# Patient Record
Sex: Male | Born: 1955 | Race: White | Hispanic: No | Marital: Married | Smoking: Never smoker
Health system: Southern US, Community
[De-identification: ages and names within clinical notes are randomized; demographics above are authoritative.]

## PROBLEM LIST (undated history)

## (undated) DIAGNOSIS — I82409 Acute embolism and thrombosis of unspecified deep veins of unspecified lower extremity: Secondary | ICD-10-CM

## (undated) DIAGNOSIS — Z86010 Personal history of colonic polyps: Principal | ICD-10-CM

## (undated) DIAGNOSIS — D689 Coagulation defect, unspecified: Secondary | ICD-10-CM

## (undated) HISTORY — PX: POLYPECTOMY: SHX149

## (undated) HISTORY — DX: Coagulation defect, unspecified: D68.9

## (undated) HISTORY — DX: Personal history of colonic polyps: Z86.010

---

## 2006-12-21 ENCOUNTER — Ambulatory Visit: Payer: Self-pay | Admitting: Family Medicine

## 2006-12-24 LAB — CONVERTED CEMR LAB
Alkaline Phosphatase: 46 units/L (ref 39–117)
BUN: 19 mg/dL (ref 6–23)
Bilirubin, Direct: 0.1 mg/dL (ref 0.0–0.3)
CO2: 31 meq/L (ref 19–32)
Cholesterol: 173 mg/dL (ref 0–200)
Creatinine, Ser: 1 mg/dL (ref 0.4–1.5)
Eosinophils Absolute: 0 10*3/uL (ref 0.0–0.6)
Glucose, Bld: 93 mg/dL (ref 70–99)
HDL: 59.5 mg/dL (ref >39.0)
Lymphocytes Relative: 28.5 % (ref 12.0–46.0)
MCHC: 34.9 g/dL (ref 30.0–36.0)
MCV: 88.2 fL (ref 78.0–100.0)
Neutro Abs: 3.7 10*3/uL (ref 1.4–7.7)
Platelets: 225 10*3/uL (ref 150–400)
Potassium: 4.5 meq/L (ref 3.5–5.1)
RBC: 4.61 M/uL (ref 4.22–5.81)
Total Bilirubin: 0.8 mg/dL (ref 0.3–1.2)
Total CHOL/HDL Ratio: 2.9
Total Protein: 7.2 g/dL (ref 6.0–8.3)
Triglycerides: 50 mg/dL (ref 0–149)

## 2006-12-25 ENCOUNTER — Encounter: Payer: Self-pay | Admitting: Family Medicine

## 2007-12-24 ENCOUNTER — Ambulatory Visit: Payer: Self-pay | Admitting: Family Medicine

## 2007-12-24 LAB — CONVERTED CEMR LAB
Glucose, Urine, Semiquant: NEGATIVE
Urobilinogen, UA: 0.2
WBC Urine, dipstick: NEGATIVE

## 2008-01-06 ENCOUNTER — Ambulatory Visit: Payer: Self-pay | Admitting: Family Medicine

## 2008-01-06 LAB — CONVERTED CEMR LAB
ALT: 15 units/L (ref 0–53)
AST: 23 units/L (ref 0–37)
Albumin: 4.4 g/dL (ref 3.5–5.2)
BUN: 18 mg/dL (ref 6–23)
Basophils Relative: 0.5 % (ref 0.0–3.0)
CO2: 29 meq/L (ref 19–32)
Chloride: 110 meq/L (ref 96–112)
Creatinine, Ser: 1.1 mg/dL (ref 0.4–1.5)
Eosinophils Relative: 1.2 % (ref 0.0–5.0)
LDL Cholesterol: 93 mg/dL (ref 0–99)
Lymphocytes Relative: 37.5 % (ref 12.0–46.0)
Monocytes Relative: 8.7 % (ref 3.0–12.0)
Neutrophils Relative %: 52.1 % (ref 43.0–77.0)
PSA: 0.56 ng/mL (ref 0.10–4.00)
RBC: 4.64 M/uL (ref 4.22–5.81)
Total Bilirubin: 0.9 mg/dL (ref 0.3–1.2)
VLDL: 8 mg/dL (ref 0–40)
WBC: 4.1 10*3/uL — ABNORMAL LOW (ref 4.5–10.5)

## 2009-01-04 ENCOUNTER — Ambulatory Visit: Payer: Self-pay | Admitting: Family Medicine

## 2009-01-04 LAB — CONVERTED CEMR LAB
Ketones, urine, test strip: NEGATIVE
Nitrite: NEGATIVE
Urobilinogen, UA: 0.2

## 2009-01-16 LAB — CONVERTED CEMR LAB
ALT: 19 units/L (ref 0–53)
Alkaline Phosphatase: 42 units/L (ref 39–117)
Basophils Relative: 0.3 % (ref 0.0–3.0)
Bilirubin, Direct: 0.1 mg/dL (ref 0.0–0.3)
Calcium: 9.1 mg/dL (ref 8.4–10.5)
Chloride: 104 meq/L (ref 96–112)
Creatinine, Ser: 1 mg/dL (ref 0.4–1.5)
Eosinophils Relative: 1.5 % (ref 0.0–5.0)
GFR calc non Af Amer: 83.79 mL/min (ref >60)
LDL Cholesterol: 89 mg/dL (ref 0–99)
Lymphocytes Relative: 36.2 % (ref 12.0–46.0)
MCV: 94.3 fL (ref 78.0–100.0)
Monocytes Relative: 9 % (ref 3.0–12.0)
Neutrophils Relative %: 53 % (ref 43.0–77.0)
RBC: 4.46 M/uL (ref 4.22–5.81)
Total Bilirubin: 0.9 mg/dL (ref 0.3–1.2)
Total CHOL/HDL Ratio: 3
Total Protein: 7.5 g/dL (ref 6.0–8.3)
Triglycerides: 58 mg/dL (ref 0.0–149.0)
VLDL: 11.6 mg/dL (ref 0.0–40.0)
WBC: 4.5 10*3/uL (ref 4.5–10.5)

## 2009-01-22 ENCOUNTER — Ambulatory Visit: Payer: Self-pay | Admitting: Family Medicine

## 2009-02-09 ENCOUNTER — Encounter (INDEPENDENT_AMBULATORY_CARE_PROVIDER_SITE_OTHER): Payer: Self-pay | Admitting: *Deleted

## 2009-02-20 ENCOUNTER — Telehealth: Payer: Self-pay | Admitting: Family Medicine

## 2009-03-02 ENCOUNTER — Encounter (INDEPENDENT_AMBULATORY_CARE_PROVIDER_SITE_OTHER): Payer: Self-pay

## 2009-03-02 ENCOUNTER — Ambulatory Visit: Payer: Self-pay | Admitting: Internal Medicine

## 2009-03-19 ENCOUNTER — Ambulatory Visit: Payer: Self-pay | Admitting: Internal Medicine

## 2009-03-19 HISTORY — PX: COLONOSCOPY: SHX174

## 2009-03-21 ENCOUNTER — Encounter: Payer: Self-pay | Admitting: Internal Medicine

## 2012-02-06 ENCOUNTER — Encounter: Payer: Self-pay | Admitting: Internal Medicine

## 2012-10-13 ENCOUNTER — Encounter: Payer: Self-pay | Admitting: Internal Medicine

## 2014-08-15 ENCOUNTER — Encounter: Payer: Self-pay | Admitting: Internal Medicine

## 2015-12-04 ENCOUNTER — Telehealth: Payer: Self-pay | Admitting: Family Medicine

## 2015-12-04 NOTE — Telephone Encounter (Signed)
Pt last seen dr fry 2010 and would to re-est. Can I sch?

## 2015-12-05 NOTE — Telephone Encounter (Signed)
Yes I can see him again

## 2015-12-06 NOTE — Telephone Encounter (Signed)
lmom for pt to call back

## 2015-12-06 NOTE — Telephone Encounter (Signed)
Pt has been scheduled.  °

## 2015-12-07 ENCOUNTER — Ambulatory Visit (INDEPENDENT_AMBULATORY_CARE_PROVIDER_SITE_OTHER): Payer: BLUE CROSS/BLUE SHIELD | Admitting: Family Medicine

## 2015-12-07 ENCOUNTER — Encounter: Payer: Self-pay | Admitting: Family Medicine

## 2015-12-07 VITALS — BP 106/71 | HR 59 | Temp 97.9°F | Ht 69.5 in | Wt 174.0 lb

## 2015-12-07 DIAGNOSIS — J069 Acute upper respiratory infection, unspecified: Secondary | ICD-10-CM | POA: Diagnosis not present

## 2015-12-07 NOTE — Progress Notes (Signed)
Pre visit review using our clinic review tool, if applicable. No additional management support is needed unless otherwise documented below in the visit note. 

## 2015-12-07 NOTE — Progress Notes (Signed)
   Subjective:    Patient ID: Carrel Calfee, male    DOB: 30-Mar-1956, 60 y.o.   MRN: [id number]  HPI Here for 5 weeks of a dry cough which is actually getting much better this week. For the first 2 weeks he felt alittle achy and had a low grade fever, but these resolved.  No ST or sinus congestion. He took Delsym for a week but nothing since then.    Review of Systems  Constitutional: Negative.   HENT: Negative.   Eyes: Negative.   Respiratory: Positive for cough.   Cardiovascular: Negative.        Objective:   Physical Exam  Constitutional: He appears well-developed and well-nourished.  HENT:  Right Ear: External ear normal.  Left Ear: External ear normal.  Nose: Nose normal.  Mouth/Throat: Oropharynx is clear and moist.  Eyes: Conjunctivae are normal.  Neck: No thyromegaly present.  Cardiovascular: Normal rate, regular rhythm, normal heart sounds and intact distal pulses.   Pulmonary/Chest: Effort normal and breath sounds normal. No respiratory distress. He has no wheezes. He has no rales.  Lymphadenopathy:    He has no cervical adenopathy.          Assessment & Plan:  He is getting over a viral URI. He seems to be at the end of it, so no treatment is required.  Laurey Morale, MD

## 2016-01-23 ENCOUNTER — Telehealth: Payer: Self-pay | Admitting: Family Medicine

## 2016-01-23 NOTE — Telephone Encounter (Signed)
Pt was seen on 12-07-15 for uri. Pt was told if no better in few week to callback. Please advice

## 2016-01-23 NOTE — Telephone Encounter (Signed)
Call in Biaxin 500 mg to take bid for 10 days

## 2016-01-24 NOTE — Telephone Encounter (Signed)
Left voicemail for patient asking for which pharmacy he would the medication sent too.

## 2016-01-25 MED ORDER — CLARITHROMYCIN 500 MG PO TABS: 500.0000 mg | ORAL_TABLET | Freq: Two times a day (BID) | ORAL | 0 refills | Status: DC

## 2016-01-25 NOTE — Telephone Encounter (Signed)
Pt pharm is [facility] [address]

## 2016-01-25 NOTE — Telephone Encounter (Signed)
Rx has been sent  

## 2016-02-19 ENCOUNTER — Encounter: Payer: Self-pay | Admitting: Family Medicine

## 2016-02-19 ENCOUNTER — Ambulatory Visit (INDEPENDENT_AMBULATORY_CARE_PROVIDER_SITE_OTHER): Payer: BLUE CROSS/BLUE SHIELD | Admitting: Family Medicine

## 2016-02-19 VITALS — BP 121/76 | HR 52 | Temp 98.2°F | Ht 69.25 in | Wt 178.0 lb

## 2016-02-19 DIAGNOSIS — Z0000 Encounter for general adult medical examination without abnormal findings: Secondary | ICD-10-CM

## 2016-02-19 LAB — BASIC METABOLIC PANEL
BUN: 16 mg/dL (ref 6–23)
CALCIUM: 9.6 mg/dL (ref 8.4–10.5)
CO2: 28 meq/L (ref 19–32)
Chloride: 101 mEq/L (ref 96–112)
Creatinine, Ser: 1.01 mg/dL (ref 0.40–1.50)
GFR: 80.65 mL/min (ref >60.00)
GLUCOSE: 84 mg/dL (ref 70–99)
Potassium: 4.2 mEq/L (ref 3.5–5.1)
SODIUM: 138 meq/L (ref 135–145)

## 2016-02-19 LAB — LIPID PANEL
CHOL/HDL RATIO: 2
Cholesterol: 197 mg/dL (ref 0–200)
HDL: 94.7 mg/dL (ref >39.00)
LDL CALC: 91 mg/dL (ref 0–99)
NONHDL: 101.81
TRIGLYCERIDES: 52 mg/dL (ref 0.0–149.0)
VLDL: 10.4 mg/dL (ref 0.0–40.0)

## 2016-02-19 LAB — HEPATIC FUNCTION PANEL
ALBUMIN: 4.6 g/dL (ref 3.5–5.2)
ALK PHOS: 54 U/L (ref 39–117)
ALT: 25 U/L (ref 0–53)
AST: 33 U/L (ref 0–37)
BILIRUBIN DIRECT: 0.2 mg/dL (ref 0.0–0.3)
TOTAL PROTEIN: 7.2 g/dL (ref 6.0–8.3)
Total Bilirubin: 0.8 mg/dL (ref 0.2–1.2)

## 2016-02-19 LAB — CBC WITH DIFFERENTIAL/PLATELET
BASOS PCT: 0.3 % (ref 0.0–3.0)
Basophils Absolute: 0 10*3/uL (ref 0.0–0.1)
EOS PCT: 0.7 % (ref 0.0–5.0)
Eosinophils Absolute: 0 10*3/uL (ref 0.0–0.7)
HCT: 42.4 % (ref 39.0–52.0)
Hemoglobin: 14.4 g/dL (ref 13.0–17.0)
LYMPHS ABS: 2.6 10*3/uL (ref 0.7–4.0)
Lymphocytes Relative: 36.1 % (ref 12.0–46.0)
MCHC: 34 g/dL (ref 30.0–36.0)
MCV: 89.5 fl (ref 78.0–100.0)
MONO ABS: 0.6 10*3/uL (ref 0.1–1.0)
Monocytes Relative: 9 % (ref 3.0–12.0)
NEUTROS PCT: 53.9 % (ref 43.0–77.0)
Neutro Abs: 3.8 10*3/uL (ref 1.4–7.7)
PLATELETS: 182 10*3/uL (ref 150.0–400.0)
RBC: 4.74 Mil/uL (ref 4.22–5.81)
RDW: 13.4 % (ref 11.5–15.5)
WBC: 7.1 10*3/uL (ref 4.0–10.5)

## 2016-02-19 LAB — POC URINALSYSI DIPSTICK (AUTOMATED)
Bilirubin, UA: NEGATIVE
Blood, UA: NEGATIVE
GLUCOSE UA: NEGATIVE
KETONES UA: NEGATIVE
Leukocytes, UA: NEGATIVE
Nitrite, UA: NEGATIVE
Protein, UA: NEGATIVE
SPEC GRAV UA: 1.015
Urobilinogen, UA: 0.2
pH, UA: 6

## 2016-02-19 LAB — PSA: PSA: 0.74 ng/mL (ref 0.10–4.00)

## 2016-02-19 LAB — TSH: TSH: 1.51 u[IU]/mL (ref 0.35–4.50)

## 2016-02-19 MED ORDER — SILDENAFIL CITRATE 100 MG PO TABS: 100.0000 mg | ORAL_TABLET | Freq: Every day | ORAL | 11 refills | Status: DC | PRN

## 2016-02-19 NOTE — Progress Notes (Signed)
   Subjective:    Patient ID: James Cruz, male    DOB: 05-14-1955, 60 y.o.   MRN: [id number]  HPI 60 yr old male for a well exam. He feels fine but has some issues to discuss. He has had 2 lesions on the face for years but they are slowly getting larger and he may want them removed. Also he wants to try Viagra for erection difficulties.     Review of Systems  Constitutional: Negative.   HENT: Negative.   Eyes: Negative.   Respiratory: Negative.   Cardiovascular: Negative.   Gastrointestinal: Negative.   Genitourinary: Negative.   Musculoskeletal: Negative.   Skin: Negative.   Neurological: Negative.   Psychiatric/Behavioral: Negative.        Objective:   Physical Exam  Constitutional: He is oriented to person, place, and time. He appears well-developed and well-nourished. No distress.  HENT:  Head: Normocephalic and atraumatic.  Right Ear: External ear normal.  Left Ear: External ear normal.  Nose: Nose normal.  Mouth/Throat: Oropharynx is clear and moist. No oropharyngeal exudate.  Eyes: Conjunctivae and EOM are normal. Pupils are equal, round, and reactive to light. Right eye exhibits no discharge. Left eye exhibits no discharge. No scleral icterus.  Neck: Neck supple. No JVD present. No tracheal deviation present. No thyromegaly present.  Cardiovascular: Normal rate, regular rhythm, normal heart sounds and intact distal pulses.  Exam reveals no gallop and no friction rub.   No murmur heard. Pulmonary/Chest: Effort normal and breath sounds normal. No respiratory distress. He has no wheezes. He has no rales. He exhibits no tenderness.  Abdominal: Soft. Bowel sounds are normal. He exhibits no distension and no mass. There is no tenderness. There is no rebound and no guarding.  Genitourinary: Rectum normal, prostate normal and penis normal. Rectal exam shows guaiac negative stool. No penile tenderness.  Musculoskeletal: Normal range of motion. He exhibits no edema or  tenderness.  Lymphadenopathy:    He has no cervical adenopathy.  Neurological: He is alert and oriented to person, place, and time. He has normal reflexes. No cranial nerve deficit. He exhibits normal muscle tone. Coordination normal.  Skin: Skin is warm and dry. No rash noted. He is not diaphoretic. No erythema. No pallor.  There is a firm papular lesion on the upper lip and and a fleshy papular lesion on the left corner of the mouth. These both appear to be benign.  Psychiatric: He has a normal mood and affect. His behavior is normal. Judgment and thought content normal.          Assessment & Plan:  Well exam. He will get fasting labs. He is quite past due for a follow up colonoscopy, so we will set this up. He wants to check his work schedule before we refer to Dermatology to remove the facial lesions. Try Viagra for the ED.  Laurey Morale, MD

## 2016-02-19 NOTE — Progress Notes (Signed)
Pre visit review using our clinic review tool, if applicable. No additional management support is needed unless otherwise documented below in the visit note. 

## 2016-02-29 ENCOUNTER — Telehealth: Payer: Self-pay | Admitting: Family Medicine

## 2016-02-29 NOTE — Telephone Encounter (Signed)
Wife would like to know if pt's health assessment form is ready to be picked up.

## 2016-03-03 NOTE — Telephone Encounter (Signed)
Form is ready for pick up here at front office and I left a voice message for pt with this information.

## 2016-03-14 ENCOUNTER — Telehealth: Payer: Self-pay | Admitting: Family Medicine

## 2016-03-14 NOTE — Telephone Encounter (Signed)
Insurance will only cover Cialis and send to [facility].

## 2016-03-14 NOTE — Telephone Encounter (Signed)
Cancel Viagra and change to Cialis 20 mg to take prn, #10 with 11 rf

## 2016-03-17 MED ORDER — TADALAFIL 20 MG PO TABS: ORAL_TABLET | ORAL | 11 refills | Status: AC

## 2016-03-17 NOTE — Telephone Encounter (Signed)
Rx sent 

## 2016-05-05 ENCOUNTER — Encounter: Payer: Self-pay | Admitting: Family Medicine

## 2016-10-31 ENCOUNTER — Emergency Department (HOSPITAL_COMMUNITY): Payer: BLUE CROSS/BLUE SHIELD

## 2016-10-31 ENCOUNTER — Encounter (HOSPITAL_COMMUNITY): Payer: Self-pay | Admitting: Nurse Practitioner

## 2016-10-31 ENCOUNTER — Emergency Department (HOSPITAL_COMMUNITY)
Admission: EM | Admit: 2016-10-31 | Discharge: 2016-10-31 | Disposition: A | Discharge status: Home / Self Care | Payer: BLUE CROSS/BLUE SHIELD | Attending: Emergency Medicine | Admitting: Emergency Medicine

## 2016-10-31 DIAGNOSIS — Y999 Unspecified external cause status: Secondary | ICD-10-CM | POA: Insufficient documentation

## 2016-10-31 DIAGNOSIS — S6992XA Unspecified injury of left wrist, hand and finger(s), initial encounter: Secondary | ICD-10-CM | POA: Diagnosis present

## 2016-10-31 DIAGNOSIS — Y9316 Activity, rowing, canoeing, kayaking, rafting and tubing: Secondary | ICD-10-CM | POA: Diagnosis not present

## 2016-10-31 DIAGNOSIS — Y9289 Other specified places as the place of occurrence of the external cause: Secondary | ICD-10-CM | POA: Insufficient documentation

## 2016-10-31 DIAGNOSIS — X58XXXA Exposure to other specified factors, initial encounter: Secondary | ICD-10-CM | POA: Diagnosis not present

## 2016-10-31 DIAGNOSIS — S62665A Nondisplaced fracture of distal phalanx of left ring finger, initial encounter for closed fracture: Secondary | ICD-10-CM | POA: Insufficient documentation

## 2016-10-31 DIAGNOSIS — S60445A External constriction of left ring finger, initial encounter: Secondary | ICD-10-CM | POA: Insufficient documentation

## 2016-10-31 DIAGNOSIS — W4904XA Ring or other jewelry causing external constriction, initial encounter: Secondary | ICD-10-CM | POA: Insufficient documentation

## 2016-10-31 DIAGNOSIS — S62669A Nondisplaced fracture of distal phalanx of unspecified finger, initial encounter for closed fracture: Secondary | ICD-10-CM

## 2016-10-31 HISTORY — DX: Acute embolism and thrombosis of unspecified deep veins of unspecified lower extremity: I82.409

## 2016-10-31 NOTE — ED Provider Notes (Signed)
[facility] DEPT Provider Note   CSN: [id number] Arrival date & time: 10/31/16  1146     History   Chief Complaint Chief Complaint  Patient presents with  . Finger Injury    HPI James Cruz is a 61 y.o. male.  Finger injury 2 days ago riding a water tube.  Persistent pain and swelling.  Concern about injury being on ring finger with ring still on.  No other injuries.  There are no other known modifying factors.  HPI  Past Medical History:  Diagnosis Date  . DVT (deep venous thrombosis) ([place])     There are no active problems to display for this patient.   Past Surgical History:  Procedure Laterality Date  . COLONOSCOPY  03/19/2009   per Dr. Carlean Purl, adenomatous polyps, repeat in 3 yrs        Home Medications    Prior to Admission medications   Medication Sig Start Date End Date Taking? Authorizing Provider  tadalafil (CIALIS) 20 MG tablet Take 1 tablet by mouth as needed. 03/17/16   Laurey Morale, MD    Family History History reviewed. No pertinent family history.  Social History Social History  Substance Use Topics  . Smoking status: Never Smoker  . Smokeless tobacco: Never Used  . Alcohol use 1.2 oz/week    2 Cans of beer per week     Comment: most evenings     Allergies   Patient has no known allergies.   Review of Systems Review of Systems  All other systems reviewed and are negative.    Physical Exam Updated Vital Signs BP 123/86 (BP Location: Right Arm)   Pulse (!) 53   Temp 97.7 F (36.5 C) (Oral)   Resp 20   Ht 5\' 9"  (1.753 m)   Wt 77.1 kg (170 lb)   SpO2 98%   BMI 25.10 kg/m   Physical Exam  Constitutional: He is oriented to person, place, and time. He appears well-developed and well-nourished.  HENT:  Head: Normocephalic and atraumatic.  Right Ear: External ear normal.  Left Ear: External ear normal.  Eyes: Pupils are equal, round, and reactive to light. Conjunctivae and EOM are normal.  Neck: Normal range of  motion and phonation normal. Neck supple.  Cardiovascular: Normal rate.   Pulmonary/Chest: Effort normal. He exhibits no bony tenderness.  Musculoskeletal:  Left ring finger tender and swollen primarily distally with ecchymosis proximal to the nail.  Fair motion at PIP and DIP joints.  No significant deformity.  Neurovascular intact distally.  Ring on fourth finger appears to be impeding lymphatic flow proximally.  Neurological: He is alert and oriented to person, place, and time. No cranial nerve deficit or sensory deficit. He exhibits normal muscle tone. Coordination normal.  Skin: Skin is warm, dry and intact.  Psychiatric: He has a normal mood and affect. His behavior is normal. Judgment and thought content normal.  Nursing note and vitals reviewed.    ED Treatments / Results  Labs (all labs ordered are listed, but only abnormal results are displayed) Labs Reviewed - No data to display  EKG  EKG Interpretation None       Radiology Dg Finger Ring Left  Result Date: 10/31/2016 CLINICAL DATA:  61 year old male with pain at the distal aspect of the left ring finger EXAM: LEFT RING FINGER 2+V COMPARISON:  None. FINDINGS: Nondisplaced fracture through the base of the distal phalanx of the ring finger. The remainder the visualized bones and joints are unremarkable. IMPRESSION:  Nondisplaced fracture through the base of the distal phalanx of the ring finger. Electronically Signed   By: Jacqulynn Cadet M.D.   On: 10/31/2016 12:55    Procedures .Foreign Body Removal Date/Time: 10/31/2016 5:14 PM Performed by: Daleen Bo Authorized by: Daleen Bo  Risks and benefits: risks, benefits and alternatives were discussed Consent given by: patient Patient understanding: patient states understanding of the procedure being performed Patient identity confirmed: verbally with patient Time out: Immediately prior to procedure a "time out" was called to verify the correct patient, procedure,  equipment, support staff and site/side marked as required. Intake: Left ring finger. 1 objects recovered. Objects recovered: Wedding band Patient tolerance: Patient tolerated the procedure well with no immediate complications Comments: Ring removed using umbilical tape, to compress edema, and pulling off, in standard technique.   (including critical care time)  Medications Ordered in ED Medications - No data to display   Initial Impression / Assessment and Plan / ED Course  I have reviewed the triage vital signs and the nursing notes.  Pertinent labs & imaging results that were available during my care of the patient were reviewed by me and considered in my medical decision making (see chart for details).      Patient Vitals for the past 24 hrs:  BP Temp Temp src Pulse Resp SpO2 Height Weight  10/31/16 1316 123/86 - - (!) 53 20 98 % - -  10/31/16 1154 (!) 142/90 97.7 F (36.5 C) Oral (!) 51 18 100 % 5\' 9"  (1.753 m) 77.1 kg (170 lb)    At discharge- reevaluation with update and discussion. After initial assessment and treatment, an updated evaluation reveals he is comfortable has no further complaints.  Findings discussed with the patient and all questions answered. Mistey Hoffert L    Final Clinical Impressions(s) / ED Diagnoses   Final diagnoses:  Closed nondisplaced fracture of distal phalanx of finger, unspecified finger, initial encounter    Finger fracture, nondisplaced, without nail injury, complicated by ring impeding lymphatic drainage.  Ring removed to improve recovery and prevent ischemia.  Findings discussed with the patient.  Nursing Notes Reviewed/ Care Coordinated Applicable Imaging Reviewed Interpretation of Laboratory Data incorporated into ED treatment  The patient appears reasonably screened and/or stabilized for discharge and I doubt any other medical condition or other [facility] requiring further screening, evaluation, or treatment in the ED at this time prior  to discharge.  Plan: Home Medications-APAP for pain, continue usual medications; Home Treatments-split finger for 3 weeks; return here if the recommended treatment, does not improve the symptoms; Recommended follow up-PCP, as needed   New Prescriptions Discharge Medication List as of 10/31/2016  1:09 PM       Daleen Bo, MD 10/31/16 [id number]

## 2016-10-31 NOTE — ED Triage Notes (Signed)
Pt presents with finger swelling. [place] He reports pain, bruising and swelling to the finger since. He has been applying ice but swelling has increased and he is unable to remove his ring today.

## 2016-10-31 NOTE — ED Notes (Signed)
ED Provider at bedside. 

## 2018-04-30 ENCOUNTER — Encounter: Payer: Self-pay | Admitting: Internal Medicine

## 2018-05-05 ENCOUNTER — Encounter: Payer: Self-pay | Admitting: Internal Medicine

## 2018-05-05 ENCOUNTER — Ambulatory Visit (AMBULATORY_SURGERY_CENTER): Payer: Self-pay | Admitting: *Deleted

## 2018-05-05 VITALS — Ht 69.0 in | Wt 181.6 lb

## 2018-05-05 DIAGNOSIS — Z86010 Personal history of colonic polyps: Secondary | ICD-10-CM

## 2018-05-05 NOTE — Progress Notes (Signed)
No egg or soy allergy known to patient  No issues with past sedation with any surgeries  or procedures, no intubation problems  No diet pills per patient No home 02 use per patient  No blood thinners per patient  Pt denies issues with constipation  No A fib or A flutter  EMMI video sent to pt's e mail pt declined   

## 2018-05-17 ENCOUNTER — Ambulatory Visit (AMBULATORY_SURGERY_CENTER): Payer: BLUE CROSS/BLUE SHIELD | Admitting: Internal Medicine

## 2018-05-17 ENCOUNTER — Encounter: Payer: Self-pay | Admitting: Internal Medicine

## 2018-05-17 VITALS — BP 110/60 | HR 55 | Temp 97.8°F | Resp 18 | Ht 69.0 in | Wt 181.0 lb

## 2018-05-17 DIAGNOSIS — Z860100 Personal history of colon polyps, unspecified: Secondary | ICD-10-CM

## 2018-05-17 DIAGNOSIS — D120 Benign neoplasm of cecum: Secondary | ICD-10-CM

## 2018-05-17 DIAGNOSIS — D122 Benign neoplasm of ascending colon: Secondary | ICD-10-CM

## 2018-05-17 DIAGNOSIS — K635 Polyp of colon: Secondary | ICD-10-CM | POA: Diagnosis not present

## 2018-05-17 DIAGNOSIS — D121 Benign neoplasm of appendix: Secondary | ICD-10-CM | POA: Diagnosis not present

## 2018-05-17 HISTORY — PX: COLONOSCOPY: SHX174

## 2018-05-17 MED ORDER — SODIUM CHLORIDE 0.9 % IV SOLN: 500.0000 mL | Freq: Once | INTRAVENOUS | Status: DC

## 2018-05-17 NOTE — Progress Notes (Signed)
Called to room to assist during endoscopic procedure.  Patient ID and intended procedure confirmed with present staff. Received instructions for my participation in the procedure from the performing physician.  

## 2018-05-17 NOTE — Progress Notes (Signed)
Pt. Reports no change In his medical or surgical history since his pre-visit 05/05/2018.

## 2018-05-17 NOTE — Patient Instructions (Addendum)
Handouts given on polyps and diverticulosis. Options for 1st meal: Eggs Grits Toast Pancakes/waffles Lean meat  YOU HAD AN ENDOSCOPIC PROCEDURE TODAY AT [facility]:   Refer to the procedure report that was given to you for any specific questions about what was found during the examination.  If the procedure report does not answer your questions, please call your gastroenterologist to clarify.  If you requested that your care partner not be given the details of your procedure findings, then the procedure report has been included in a sealed envelope for you to review at your convenience later.  YOU SHOULD EXPECT: Some feelings of bloating in the abdomen. Passage of more gas than usual.  Walking can help get rid of the air that was put into your GI tract during the procedure and reduce the bloating. If you had a lower endoscopy (such as a colonoscopy or flexible sigmoidoscopy) you may notice spotting of blood in your stool or on the toilet paper. If you underwent a bowel prep for your procedure, you may not have a normal bowel movement for a few days.  Please Note:  You might notice some irritation and congestion in your nose or some drainage.  This is from the oxygen used during your procedure.  There is no need for concern and it should clear up in a day or so.  SYMPTOMS TO REPORT IMMEDIATELY:   Following lower endoscopy (colonoscopy or flexible sigmoidoscopy):  Excessive amounts of blood in the stool  Significant tenderness or worsening of abdominal pains  Swelling of the abdomen that is new, acute  Fever of 100F or higher   For urgent or emergent issues, a gastroenterologist can be reached at any hour by calling [phone].   DIET:  We do recommend a small meal at first, but then you may proceed to your regular diet.  Drink plenty of fluids but you should avoid alcoholic beverages for 24 hours.  ACTIVITY:  You should plan to take it easy for the rest of today and  you should NOT DRIVE or use heavy machinery until tomorrow (because of the sedation medicines used during the test).    FOLLOW UP: Our staff will call the number listed on your records the next business day following your procedure to check on you and address any questions or concerns that you may have regarding the information given to you following your procedure. If we do not reach you, we will leave a message.  However, if you are feeling well and you are not experiencing any problems, there is no need to return our call.  We will assume that you have returned to your regular daily activities without incident.  If any biopsies were taken you will be contacted by phone or by letter within the next 1-3 weeks.  Please call us at [phone] if you have not heard about the biopsies in 3 weeks.    SIGNATURES/CONFIDENTIALITY: You and/or your care partner have signed paperwork which will be entered into your electronic medical record.  These signatures attest to the fact that that the information above on your After Visit Summary has been reviewed and is understood.  Full responsibility of the confidentiality of this discharge information lies with you and/or your care-partner.

## 2018-05-17 NOTE — Op Note (Signed)
[name] Patient Name: James Cruz Procedure Date: 05/17/2018 10:13 AM MRN: [id number] Endoscopist: Gatha Mayer , MD Age: 63 Referring MD:  Date of Birth: Dec 25, 1955 Gender: Male Account #: [id number] Procedure:                Colonoscopy Indications:              High risk colon cancer surveillance: Personal                            history of sessile serrated colon polyp (less than                            10 mm in size) with no dysplasia, Last colonoscopy:                            2010 Medicines:                Propofol per Anesthesia, Monitored Anesthesia Care Procedure:                Pre-Anesthesia Assessment:                           - Prior to the procedure, a History and Physical                            was performed, and patient medications and                            allergies were reviewed. The patient's tolerance of                            previous anesthesia was also reviewed. The risks                            and benefits of the procedure and the sedation                            options and risks were discussed with the patient.                            All questions were answered, and informed consent                            was obtained. Prior Anticoagulants: The patient has                            taken no previous anticoagulant or antiplatelet                            agents. ASA Grade Assessment: I - A normal, healthy                            patient. After reviewing the risks and benefits,  the patient was deemed in satisfactory condition to                            undergo the procedure.                           After obtaining informed consent, the colonoscope                            was passed under direct vision. Throughout the                            procedure, the patient's blood pressure, pulse, and                            oxygen saturations were monitored  continuously. The                            Colonoscope was introduced through the anus and                            advanced to the the cecum, identified by                            appendiceal orifice and ileocecal valve. The                            colonoscopy was performed without difficulty. The                            patient tolerated the procedure well. The quality                            of the bowel preparation was good. The bowel                            preparation used was Miralax. The ileocecal valve,                            appendiceal orifice, and rectum were photographed. Scope In: 10:25:19 AM Scope Out: 10:42:15 AM Scope Withdrawal Time: 0 hours 14 minutes 42 seconds  Total Procedure Duration: 0 hours 16 minutes 56 seconds  Findings:                 The perianal and digital rectal examinations were                            normal. Pertinent negatives include normal prostate                            (size, shape, and consistency).                           Three sessile polyps were found in the ascending  colon and appendiceal orifice. The polyps were                            diminutive in size. These polyps were removed with                            a cold snare. Resection and retrieval were                            complete. Verification of patient identification                            for the specimen was done. Estimated blood loss was                            minimal.                           Multiple diverticula were found in the sigmoid                            colon.                           The exam was otherwise without abnormality on                            direct and retroflexion views. Complications:            No immediate complications. Estimated Blood Loss:     Estimated blood loss was minimal. Impression:               - Three diminutive polyps in the ascending colon                             and at the appendiceal orifice, removed with a cold                            snare. Resected and retrieved.                           - Diverticulosis in the sigmoid colon.                           - The examination was otherwise normal on direct                            and retroflexion views.                           - Personal history of colonic polyp sessile                            serrated adenoma 2010. Recommendation:           - Patient has a contact number available for  emergencies. The signs and symptoms of potential                            delayed complications were discussed with the                            patient. Return to normal activities tomorrow.                            Written discharge instructions were provided to the                            patient.                           - Resume previous diet.                           - Continue present medications.                           - Repeat colonoscopy is recommended for                            surveillance. The colonoscopy date will be                            determined after pathology results from today's                            exam become available for review. Gatha Mayer, MD 05/17/2018 10:47:32 AM This report has been signed electronically.

## 2018-05-17 NOTE — Progress Notes (Signed)
Report given to PACU, vss 

## 2018-05-18 ENCOUNTER — Telehealth: Payer: Self-pay

## 2018-05-18 NOTE — Telephone Encounter (Signed)
  Follow up Call-  Call back number 05/17/2018  Post procedure Call Back phone  # [phone]  Permission to leave phone message Yes  Some recent data might be hidden     Patient questions:  Do you have a fever, pain , or abdominal swelling? No. Pain Score  0 *  Have you tolerated food without any problems? Yes.    Have you been able to return to your normal activities? Yes.    Do you have any questions about your discharge instructions: Diet   No. Medications  No. Follow up visit  No.  Do you have questions or concerns about your Care? No.  Actions: * If pain score is 4 or above: No action needed, pain <4.

## 2018-05-21 ENCOUNTER — Encounter: Payer: Self-pay | Admitting: Internal Medicine

## 2018-05-21 DIAGNOSIS — Z860100 Personal history of colon polyps, unspecified: Secondary | ICD-10-CM | POA: Insufficient documentation

## 2018-05-21 HISTORY — DX: Personal history of colonic polyps: Z86.010

## 2018-05-21 NOTE — Progress Notes (Signed)
2 adenomas and ssp Recall 2025

## 2018-09-02 IMAGING — DX DG FINGER RING 2+V*L*
3 series · 3 of 3 positions shown · non-contrast
Comparison: None.

CLINICAL DATA: 60-year-old male with pain at the distal aspect of
the left ring finger

EXAM:
LEFT RING FINGER 2+V

[x finger pa left]
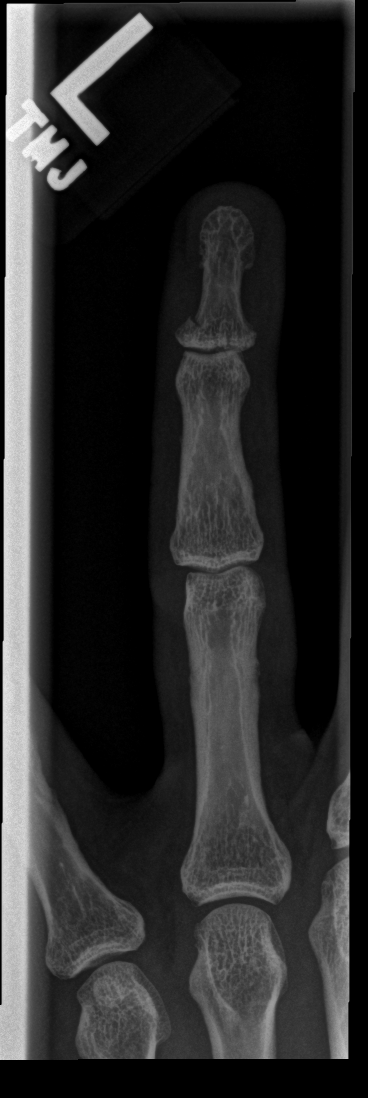

[x finger obl left]
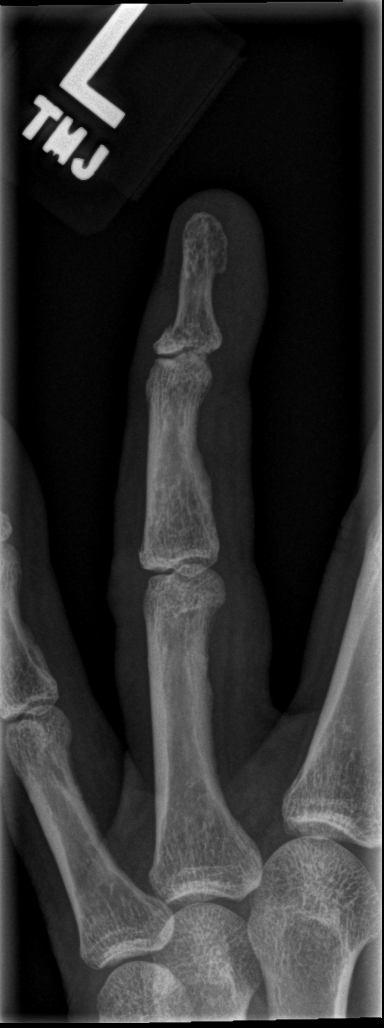

[x finger lat left]
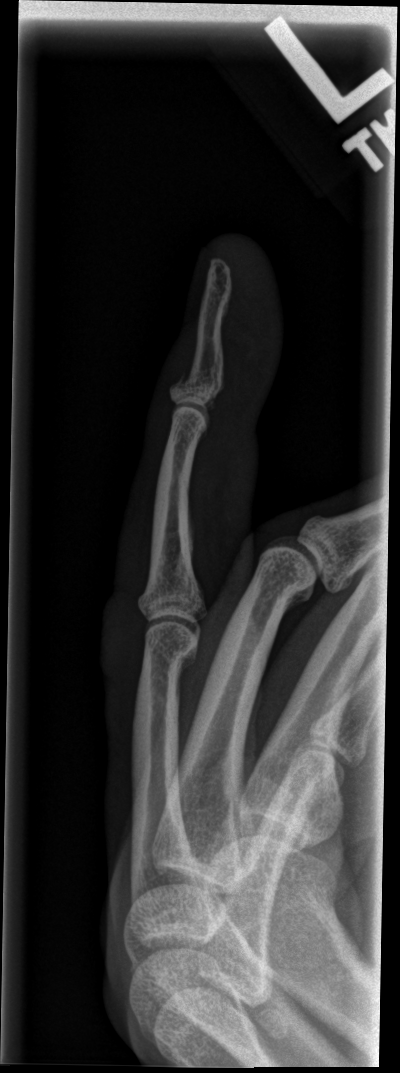

[3 of 3 positions shown; findings below may reference images not displayed]

FINDINGS: Nondisplaced fracture through the base of the distal phalanx of the
ring finger. The remainder the visualized bones and joints are
unremarkable.
IMPRESSION: Nondisplaced fracture through the base of the distal phalanx of the
ring finger.

## 2023-05-13 ENCOUNTER — Encounter: Payer: Self-pay | Admitting: Internal Medicine

## 2024-02-23 ENCOUNTER — Telehealth: Payer: Self-pay | Admitting: Family Medicine

## 2024-02-23 NOTE — Telephone Encounter (Signed)
 Copied from CRM [id number]. Topic: Appointments - Appointment Scheduling >> Feb 23, 2024  3:52 PM Rea ORN wrote: Pt wife Alisa called to make an CPE appt for pt. I advise that the pt has not been seen in over 3 years, last appt was 2017. Alisa stated that they are friends with Dr. Johnny and is asking if will see the pt again.   Please call back Alisa to advise, [phone].

## 2024-02-24 NOTE — Telephone Encounter (Signed)
 Yes I would be happy to see him

## 2024-03-01 NOTE — Telephone Encounter (Signed)
Pt has been scheduled for a new pt appointment per Dr Clent Ridges

## 2024-03-18 ENCOUNTER — Ambulatory Visit: Admitting: Family Medicine

## 2024-04-15 ENCOUNTER — Encounter: Payer: Self-pay | Admitting: Family Medicine

## 2024-04-15 ENCOUNTER — Ambulatory Visit: Payer: Self-pay | Admitting: Family Medicine

## 2024-04-15 ENCOUNTER — Ambulatory Visit (INDEPENDENT_AMBULATORY_CARE_PROVIDER_SITE_OTHER): Admitting: Family Medicine

## 2024-04-15 VITALS — BP 140/80 | HR 68 | Temp 97.9°F | Ht 69.0 in | Wt 175.0 lb

## 2024-04-15 DIAGNOSIS — E785 Hyperlipidemia, unspecified: Secondary | ICD-10-CM | POA: Diagnosis not present

## 2024-04-15 DIAGNOSIS — R739 Hyperglycemia, unspecified: Secondary | ICD-10-CM

## 2024-04-15 DIAGNOSIS — Z860100 Personal history of colon polyps, unspecified: Secondary | ICD-10-CM | POA: Diagnosis not present

## 2024-04-15 DIAGNOSIS — N401 Enlarged prostate with lower urinary tract symptoms: Secondary | ICD-10-CM

## 2024-04-15 DIAGNOSIS — N138 Other obstructive and reflux uropathy: Secondary | ICD-10-CM | POA: Diagnosis not present

## 2024-04-15 LAB — CBC WITH DIFFERENTIAL/PLATELET
Basophils Absolute: 0 K/uL (ref 0.0–0.1)
Basophils Relative: 0.3 % (ref 0.0–3.0)
Eosinophils Absolute: 0 K/uL (ref 0.0–0.7)
Eosinophils Relative: 0.3 % (ref 0.0–5.0)
HCT: 40.6 % (ref 39.0–52.0)
Hemoglobin: 14.3 g/dL (ref 13.0–17.0)
Lymphocytes Relative: 25.6 % (ref 12.0–46.0)
Lymphs Abs: 1.6 K/uL (ref 0.7–4.0)
MCHC: 35.1 g/dL (ref 30.0–36.0)
MCV: 88.9 fl (ref 78.0–100.0)
Monocytes Absolute: 0.5 K/uL (ref 0.1–1.0)
Monocytes Relative: 8.2 % (ref 3.0–12.0)
Neutro Abs: 4 K/uL (ref 1.4–7.7)
Neutrophils Relative %: 65.6 % (ref 43.0–77.0)
Platelets: 223 K/uL (ref 150.0–400.0)
RBC: 4.57 Mil/uL (ref 4.22–5.81)
RDW: 13.2 % (ref 11.5–15.5)
WBC: 6.2 K/uL (ref 4.0–10.5)

## 2024-04-15 LAB — LIPID PANEL
Cholesterol: 186 mg/dL (ref 28–200)
HDL: 85.9 mg/dL
LDL Cholesterol: 91 mg/dL (ref 10–99)
NonHDL: 99.97
Total CHOL/HDL Ratio: 2
Triglycerides: 46 mg/dL (ref 10.0–149.0)
VLDL: 9.2 mg/dL (ref 0.0–40.0)

## 2024-04-15 LAB — BASIC METABOLIC PANEL WITH GFR
BUN: 14 mg/dL (ref 6–23)
CO2: 28 meq/L (ref 19–32)
Calcium: 9.2 mg/dL (ref 8.4–10.5)
Chloride: 97 meq/L (ref 96–112)
Creatinine, Ser: 1 mg/dL (ref 0.40–1.50)
GFR: 78.65 mL/min
Glucose, Bld: 89 mg/dL (ref 70–99)
Potassium: 5.1 meq/L (ref 3.5–5.1)
Sodium: 133 meq/L — ABNORMAL LOW (ref 135–145)

## 2024-04-15 LAB — HEPATIC FUNCTION PANEL
ALT: 17 U/L (ref 3–53)
AST: 27 U/L (ref 5–37)
Albumin: 4.8 g/dL (ref 3.5–5.2)
Alkaline Phosphatase: 45 U/L (ref 39–117)
Bilirubin, Direct: 0.2 mg/dL (ref 0.1–0.3)
Total Bilirubin: 0.9 mg/dL (ref 0.2–1.2)
Total Protein: 7.4 g/dL (ref 6.0–8.3)

## 2024-04-15 LAB — PSA: PSA: 1.01 ng/mL (ref 0.10–4.00)

## 2024-04-15 LAB — HEMOGLOBIN A1C: Hgb A1c MFr Bld: 5.5 % (ref 4.6–6.5)

## 2024-04-15 LAB — TSH: TSH: 1.78 u[IU]/mL (ref 0.35–5.50)

## 2024-04-15 NOTE — Progress Notes (Signed)
 "  Subjective:    Patient ID: James Cruz, male    DOB: 1955/10/07, 69 y.o.   MRN: [id number]  HPI Here to re-establish with us  after an absence of 8 years and for a check up. He feels fine and has no concerns.    Review of Systems  Constitutional: Negative.   HENT: Negative.    Eyes: Negative.   Respiratory: Negative.    Cardiovascular: Negative.   Gastrointestinal: Negative.   Genitourinary: Negative.   Musculoskeletal: Negative.   Skin: Negative.   Neurological: Negative.   Psychiatric/Behavioral: Negative.         Objective:   Physical Exam Constitutional:      General: He is not in acute distress.    Appearance: Normal appearance. He is well-developed. He is not diaphoretic.  HENT:     Head: Normocephalic and atraumatic.     Right Ear: External ear normal.     Left Ear: External ear normal.     Nose: Nose normal.     Mouth/Throat:     Pharynx: No oropharyngeal exudate.  Eyes:     General: No scleral icterus.       Right eye: No discharge.        Left eye: No discharge.     Conjunctiva/sclera: Conjunctivae normal.     Pupils: Pupils are equal, round, and reactive to light.  Neck:     Thyroid: No thyromegaly.     Vascular: No JVD.     Trachea: No tracheal deviation.  Cardiovascular:     Rate and Rhythm: Normal rate and regular rhythm.     Pulses: Normal pulses.     Heart sounds: Normal heart sounds. No murmur heard.    No friction rub. No gallop.  Pulmonary:     Effort: Pulmonary effort is normal. No respiratory distress.     Breath sounds: Normal breath sounds. No wheezing or rales.  Chest:     Chest wall: No tenderness.  Abdominal:     General: Bowel sounds are normal. There is no distension.     Palpations: Abdomen is soft. There is no mass.     Tenderness: There is no abdominal tenderness. There is no guarding or rebound.  Genitourinary:    Penis: Normal. No tenderness.      Testes: Normal.  Musculoskeletal:        General: No tenderness. Normal  range of motion.     Cervical back: Neck supple.  Lymphadenopathy:     Cervical: No cervical adenopathy.  Skin:    General: Skin is warm and dry.     Coloration: Skin is not pale.     Findings: No erythema or rash.  Neurological:     General: No focal deficit present.     Mental Status: He is alert and oriented to person, place, and time.     Cranial Nerves: No cranial nerve deficit.     Motor: No abnormal muscle tone.     Coordination: Coordination normal.     Deep Tendon Reflexes: Reflexes are normal and symmetric. Reflexes normal.  Psychiatric:        Mood and Affect: Mood normal.        Behavior: Behavior normal.        Thought Content: Thought content normal.        Judgment: Judgment normal.           Assessment & Plan:  Re-establish visit. He is doing well in general. We will get labs  to check lipids, A1c, PSA, etc. Set up another colonoscopy. I personally spent a total of 32 minutes in the care of the patient today including getting/reviewing separately obtained history, performing a medically appropriate exam/evaluation, and placing orders.  Garnette Olmsted, MD   "

## 2024-04-18 ENCOUNTER — Telehealth: Payer: Self-pay

## 2024-04-18 NOTE — Telephone Encounter (Signed)
 Copied from CRM [id number]. Topic: Clinical - Lab/Test Results >> Apr 15, 2024  3:44 PM Berneda FALCON wrote: Reason for CRM: Patient returning phone cal to [facility] about his labs. I read PCP notes to him verbatim and patient had no additional questions or concerns.

## 2024-04-20 NOTE — Telephone Encounter (Signed)
 Noted.

## 2024-05-06 ENCOUNTER — Telehealth: Payer: Self-pay | Admitting: Family Medicine

## 2024-05-06 DIAGNOSIS — Z860100 Personal history of colon polyps, unspecified: Secondary | ICD-10-CM

## 2024-05-06 NOTE — Telephone Encounter (Signed)
 Done

## 2024-05-06 NOTE — Telephone Encounter (Signed)
 Attempted to reach pt. Left a detail message that referral is placed. If have any questions to call us  back.

## 2024-05-06 NOTE — Telephone Encounter (Signed)
 Copied from CRM #[id number]. Topic: Referral - Request for Referral >> May 06, 2024  9:36 AM Deaijah H wrote: Did the patient discuss referral with their provider in the last year? Yes (If No - schedule appointment) (If Yes - send message)  Appointment offered? No  Type of order/referral and detailed reason for visit: Colonoscopy   Preference of office, provider, location: Dr. Kristie [address] [place], [place] [address] Phone [phone]  If referral order, have you been seen by this specialty before? Yes (If Yes, this issue or another issue? When? Where?  Can we respond through MyChart? No
